# Patient Record
Sex: Male | Born: 1975 | Race: White | Hispanic: No | Marital: Single | Smoking: Current every day smoker
Health system: Southern US, Community
[De-identification: ages and names within clinical notes are randomized; demographics above are authoritative.]

## PROBLEM LIST (undated history)

## (undated) DIAGNOSIS — N009 Acute nephritic syndrome with unspecified morphologic changes: Secondary | ICD-10-CM

## (undated) DIAGNOSIS — K5792 Diverticulitis of intestine, part unspecified, without perforation or abscess without bleeding: Secondary | ICD-10-CM

## (undated) DIAGNOSIS — M722 Plantar fascial fibromatosis: Secondary | ICD-10-CM

---

## 2002-01-30 ENCOUNTER — Encounter: Payer: Self-pay | Admitting: Emergency Medicine

## 2002-01-30 ENCOUNTER — Emergency Department (HOSPITAL_COMMUNITY): Admission: EM | Admit: 2002-01-30 | Discharge: 2002-01-30 | Payer: Self-pay | Admitting: Emergency Medicine

## 2002-07-11 ENCOUNTER — Encounter: Payer: Self-pay | Admitting: Emergency Medicine

## 2002-07-11 ENCOUNTER — Emergency Department (HOSPITAL_COMMUNITY): Admission: EM | Admit: 2002-07-11 | Discharge: 2002-07-11 | Payer: Self-pay | Admitting: Emergency Medicine

## 2003-01-21 ENCOUNTER — Emergency Department (HOSPITAL_COMMUNITY): Admission: AD | Admit: 2003-01-21 | Discharge: 2003-01-22 | Payer: Self-pay | Admitting: Emergency Medicine

## 2019-09-12 ENCOUNTER — Encounter (HOSPITAL_COMMUNITY): Payer: Self-pay

## 2019-09-12 ENCOUNTER — Emergency Department (HOSPITAL_COMMUNITY)

## 2019-09-12 ENCOUNTER — Other Ambulatory Visit: Payer: Self-pay

## 2019-09-12 ENCOUNTER — Emergency Department (HOSPITAL_COMMUNITY)
Admission: EM | Admit: 2019-09-12 | Discharge: 2019-09-12 | Disposition: A | Discharge status: Home / Self Care | Attending: Emergency Medicine | Admitting: Emergency Medicine

## 2019-09-12 DIAGNOSIS — R000 Tachycardia, unspecified: Secondary | ICD-10-CM | POA: Diagnosis not present

## 2019-09-12 DIAGNOSIS — R4182 Altered mental status, unspecified: Secondary | ICD-10-CM | POA: Diagnosis present

## 2019-09-12 DIAGNOSIS — R404 Transient alteration of awareness: Secondary | ICD-10-CM | POA: Insufficient documentation

## 2019-09-12 DIAGNOSIS — F17210 Nicotine dependence, cigarettes, uncomplicated: Secondary | ICD-10-CM | POA: Insufficient documentation

## 2019-09-12 HISTORY — DX: Diverticulitis of intestine, part unspecified, without perforation or abscess without bleeding: K57.92

## 2019-09-12 HISTORY — DX: Acute nephritic syndrome with unspecified morphologic changes: N00.9

## 2019-09-12 HISTORY — DX: Plantar fascial fibromatosis: M72.2

## 2019-09-12 LAB — CBC WITH DIFFERENTIAL/PLATELET
Abs Immature Granulocytes: 0.03 10*3/uL (ref 0.00–0.07)
Basophils Absolute: 0 10*3/uL (ref 0.0–0.1)
Basophils Relative: 0 %
Eosinophils Absolute: 0.1 10*3/uL (ref 0.0–0.5)
Eosinophils Relative: 1 %
HCT: 40.8 % (ref 39.0–52.0)
Hemoglobin: 13.6 g/dL (ref 13.0–17.0)
Immature Granulocytes: 0 %
Lymphocytes Relative: 17 %
Lymphs Abs: 1.4 10*3/uL (ref 0.7–4.0)
MCH: 29.8 pg (ref 26.0–34.0)
MCHC: 33.3 g/dL (ref 30.0–36.0)
MCV: 89.5 fL (ref 80.0–100.0)
Monocytes Absolute: 0.5 10*3/uL (ref 0.1–1.0)
Monocytes Relative: 6 %
Neutro Abs: 6.2 10*3/uL (ref 1.7–7.7)
Neutrophils Relative %: 76 %
Platelets: 215 10*3/uL (ref 150–400)
RBC: 4.56 MIL/uL (ref 4.22–5.81)
RDW: 12.7 % (ref 11.5–15.5)
WBC: 8.3 10*3/uL (ref 4.0–10.5)
nRBC: 0 % (ref 0.0–0.2)

## 2019-09-12 LAB — COMPREHENSIVE METABOLIC PANEL
ALT: 43 U/L (ref 0–44)
AST: 25 U/L (ref 15–41)
Albumin: 4.1 g/dL (ref 3.5–5.0)
Alkaline Phosphatase: 52 U/L (ref 38–126)
Anion gap: 10 (ref 5–15)
BUN: 19 mg/dL (ref 6–20)
CO2: 22 mmol/L (ref 22–32)
Calcium: 8.5 mg/dL — ABNORMAL LOW (ref 8.9–10.3)
Chloride: 105 mmol/L (ref 98–111)
Creatinine, Ser: 1.06 mg/dL (ref 0.61–1.24)
GFR calc Af Amer: >60 mL/min (ref >60)
GFR calc non Af Amer: >60 mL/min (ref >60)
Glucose, Bld: 121 mg/dL — ABNORMAL HIGH (ref 70–99)
Potassium: 3.1 mmol/L — ABNORMAL LOW (ref 3.5–5.1)
Sodium: 137 mmol/L (ref 135–145)
Total Bilirubin: 0.4 mg/dL (ref 0.3–1.2)
Total Protein: 7.1 g/dL (ref 6.5–8.1)

## 2019-09-12 LAB — RAPID URINE DRUG SCREEN, HOSP PERFORMED
Amphetamines: NOT DETECTED
Barbiturates: NOT DETECTED
Benzodiazepines: NOT DETECTED
Cocaine: NOT DETECTED
Opiates: NOT DETECTED
Tetrahydrocannabinol: NOT DETECTED

## 2019-09-12 LAB — URINALYSIS, ROUTINE W REFLEX MICROSCOPIC
Bilirubin Urine: NEGATIVE
Glucose, UA: NEGATIVE mg/dL
Hgb urine dipstick: NEGATIVE
Ketones, ur: NEGATIVE mg/dL
Leukocytes,Ua: NEGATIVE
Nitrite: NEGATIVE
Protein, ur: NEGATIVE mg/dL
Specific Gravity, Urine: 1.024 (ref 1.005–1.030)
pH: 6 (ref 5.0–8.0)

## 2019-09-12 LAB — ETHANOL: Alcohol, Ethyl (B): <10 mg/dL (ref <10)

## 2019-09-12 LAB — CBG MONITORING, ED: Glucose-Capillary: 124 mg/dL — ABNORMAL HIGH (ref 70–99)

## 2019-09-12 LAB — LACTIC ACID, PLASMA: Lactic Acid, Venous: 2.6 mmol/L (ref 0.5–1.9)

## 2019-09-12 LAB — CK: Total CK: 91 U/L (ref 49–397)

## 2019-09-12 MED ORDER — SODIUM CHLORIDE 0.9 % IV BOLUS
1000.0000 mL | Freq: Once | INTRAVENOUS | Status: AC
  Administered 2019-09-12: 17:00:00 1000 mL via INTRAVENOUS

## 2019-09-12 MED ORDER — SODIUM CHLORIDE 0.9 % IV SOLN: INTRAVENOUS | Status: DC

## 2019-09-12 NOTE — ED Notes (Signed)
Date and time results received: 09/12/19 1740 Test: lactate Critical Value: 2.6  Name of Provider Notified: Jeraldine Loots

## 2019-09-12 NOTE — Discharge Instructions (Addendum)
As discussed, your evaluation today has been largely reassuring.  But, it is important that you monitor your condition carefully, and do not hesitate to return to the ED if you develop new, or concerning changes in your condition.  Otherwise, please follow-up with your physician for appropriate ongoing care.  The interim, please be sure to stay well-hydrated, drink plenty of fluids.  It is important that you avoid using any illicit substances.

## 2019-09-12 NOTE — ED Notes (Signed)
Pt in bed with eyes closed, resps even and unlabored, pt answering questions appropriately.

## 2019-09-12 NOTE — ED Notes (Signed)
Pt in bed, pt mumbles to questions asked, pt doesn't open eyes, pt will attempt to follow commands if they require little effort.  Cardiac monitor placed, O2 sat monitor in place, pt denies taking anything, officer at bedside.

## 2019-09-12 NOTE — ED Triage Notes (Signed)
EMS reports pt resident at [facility].  Reports pt was standing beside his bed and wouldn't let go of bed.   Says he was staring and wouldn't respond.  When they got him on the bed, he became violent,  Staff administered narcan and pt calmed down.  EMS says when they arrived, pt was laying face down in the floor, responsive to painful stimuli.   Pt has been somewhat verbal with ems at times.  Pt was able to state social security number.  Staff found some paper in the floor beside pt and they were suspicious it may have been K2.  CBG 125, HR 100, bp 130/80.

## 2019-09-12 NOTE — ED Notes (Signed)
Report called to pt's facility, guard states that he has the needed paperwork.  Pt verbalized understanding d/c instructions and follow up. Advised to  Return for any concerns or worsening symptoms.

## 2019-09-12 NOTE — ED Provider Notes (Signed)
[facility] EMERGENCY DEPARTMENT Provider Note   CSN: [id number] Arrival date & time: 09/12/19  1603     History Chief Complaint  Patient presents with  . Altered Mental Status    Noah Hill is a 44 y.o. male.  HPI    Patient presents via EMS from a present requirement for is found to be altered.  Level 5 caveat secondary to the patient's altered mental status. Deputies and EMS report that the patient was found alone in his bunk room, holding onto the wall in a rigid manner.  He was lowered to the ground, did not fall, but has not been following commands, interacting, or verbal since that time. There are some hypothesis of the patient obtaining K2.  No report of recent illness.  Past Medical History:  Diagnosis Date  . Acute glomerulonephritis   . Diverticulitis   . Plantar fascial fibromatosis     There are no problems to display for this patient.   Past Surgical History:  Procedure Laterality Date  . ANKLE SURGERY         No family history on file.  Social History   Tobacco Use  . Smoking status: Current Every Day Smoker  . Smokeless tobacco: Never Used  Substance Use Topics  . Alcohol use: Not Currently  . Drug use: Not on file    Comment: denies    Home Medications Prior to Admission medications   Not on File    Allergies    Peanut-containing drug products, Fish allergy, Ibuprofen, and Sulfa antibiotics  Review of Systems   Review of Systems  Unable to perform ROS: Mental status change    Physical Exam Updated Vital Signs BP 136/85   Pulse 88   Temp 98.3 F (36.8 C) (Oral)   Resp 16   Ht 5\' 8"  (1.727 m)   Wt 132.9 kg   SpO2 100%   BMI 44.55 kg/m   Physical Exam Vitals and nursing note reviewed.  Constitutional:      Appearance: He is well-developed. He is obese.     Comments: Obese noninteractive male breathing easily, not following commands, but moving extremities spontaneously, minimally.  HENT:     Head:  Normocephalic and atraumatic.  Eyes:     Conjunctiva/sclera: Conjunctivae normal.  Cardiovascular:     Rate and Rhythm: Regular rhythm. Tachycardia present.  Pulmonary:     Effort: Pulmonary effort is normal. No respiratory distress.     Breath sounds: No stridor.  Abdominal:     General: There is no distension.  Skin:    General: Skin is warm and dry.  Neurological:     Comments: Noninteractive, does not follow commands.  Reflexes intact, patient moves all extremities spontaneously, though again not to commands.  Face is symmetric, there is no speech.  Psychiatric:        Cognition and Memory: Cognition is impaired.     ED Results / Procedures / Treatments   Labs (all labs ordered are listed, but only abnormal results are displayed) Labs Reviewed  LACTIC ACID, PLASMA - Abnormal; Notable for the following components:      Result Value   Lactic Acid, Venous 2.6 (*)    All other components within normal limits  COMPREHENSIVE METABOLIC PANEL - Abnormal; Notable for the following components:   Potassium 3.1 (*)    Glucose, Bld 121 (*)    Calcium 8.5 (*)    All other components within normal limits  CBG MONITORING, ED - Abnormal;  Notable for the following components:   Glucose-Capillary 124 (*)    All other components within normal limits  RAPID URINE DRUG SCREEN, HOSP PERFORMED  ETHANOL  CBC WITH DIFFERENTIAL/PLATELET  URINALYSIS, ROUTINE W REFLEX MICROSCOPIC  CK    EKG EKG Interpretation  Date/Time:  Wednesday September 12 2019 16:32:13 EDT Ventricular Rate:  97 PR Interval:    QRS Duration: 106 QT Interval:  381 QTC Calculation: 484 R Axis:   3 Text Interpretation: Sinus rhythm Consider right atrial enlargement Borderline prolonged QT interval Abnormal ECG Confirmed by Gerhard Munch [id number]) on 09/12/2019 4:41:21 PM   Radiology CT HEAD WO CONTRAST  Result Date: 09/12/2019 CLINICAL DATA:  Altered mental status EXAM: CT HEAD WITHOUT CONTRAST TECHNIQUE: Contiguous  axial images were obtained from the base of the skull through the vertex without intravenous contrast. COMPARISON:  None. FINDINGS: Brain: No acute territorial infarction, hemorrhage or intracranial mass. Ventricles are nonenlarged. Vascular: No hyperdense vessel or unexpected calcification. Skull: Normal. Negative for fracture or focal lesion. Sinuses/Orbits: Left greater than right nasal bone deformity, probably chronic given absence of overlying significant soft tissue swelling Other: None. IMPRESSION: Negative non contrasted CT appearance of the brain. Electronically Signed   By: Jasmine Pang M.D.   On: 09/12/2019 17:25   DG Chest Port 1 View  Result Date: 09/12/2019 CLINICAL DATA:  Altered mental status. EXAM: PORTABLE CHEST 1 VIEW COMPARISON:  None. FINDINGS: Monitoring leads overlie the patient. Cardiac contours upper limits of normal. Bilateral interstitial pulmonary opacities. Low lung volumes. No pleural effusion or pneumothorax. Regional skeleton is unremarkable. IMPRESSION: Bilateral interstitial pulmonary opacities may represent edema or atypical infectious process. Low lung volumes. Electronically Signed   By: Annia Belt M.D.   On: 09/12/2019 16:56    Procedures Procedures (including critical care time)  Medications Ordered in ED Medications  sodium chloride 0.9 % bolus 1,000 mL (0 mLs Intravenous Stopped 09/12/19 1740)    And  0.9 %  sodium chloride infusion ( Intravenous New Bag/Given 09/12/19 1741)    ED Course  I have reviewed the triage vital signs and the nursing notes.  Pertinent labs & imaging results that were available during my care of the patient were reviewed by me and considered in my medical decision making (see chart for details).   With broad differential of intoxication versus metabolic arrangement versus intracranial pathology versus toxicology, patient had labs, CT, x-ray, EKG all ordered.  7:16 PM Labs unremarkable, patient awake, alert, in no distress,  denies any illicit ingestants. He states that he was smoking a cigarette, when he had a period of not recalling anything.  He offers no indication of having an idea of what happened prior to ED transfer.  Vital signs are unremarkable, he has no ongoing complaints, as above, he had absent alarming findings on labs, CT, x-ray, some suspicion for ingestion of foreign substance, versus dehydration, less likely syncope, though if this is a possibility, no evidence for sustained arrhythmia, patient is appropriate for discharge to the correctional facility. Final Clinical Impression(s) / ED Diagnoses Final diagnoses:  Transient alteration of awareness    Rx / DC Orders ED Discharge Orders    None       Gerhard Munch, MD 09/12/19 [phone]

## 2021-05-17 IMAGING — CT CT HEAD W/O CM
3 series · 16 of 47 positions shown, 19 images · non-contrast
Comparison: None.

CLINICAL DATA: Altered mental status

EXAM:
CT HEAD WITHOUT CONTRAST
TECHNIQUE: Contiguous axial images were obtained from the base of the skull
through the vertex without intravenous contrast.

[Series 2: head w o · axial · 0.44mm/px · z∈[+1293,+1433]mm · 10 of 34 slices shown, 13 images]
[im 3/34  brain]
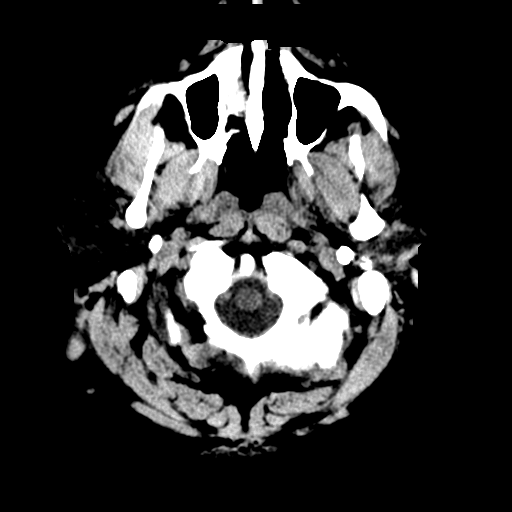
[im 3/34  bone]
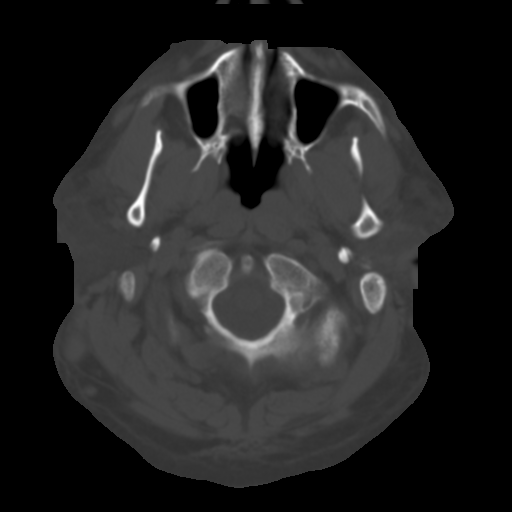
[im 6/34  brain]
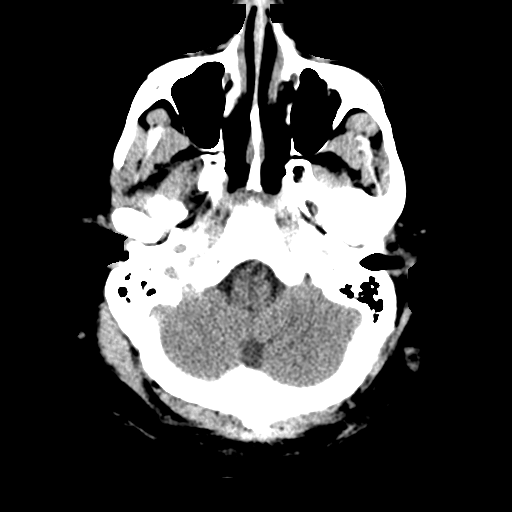
[im 10/34  brain]
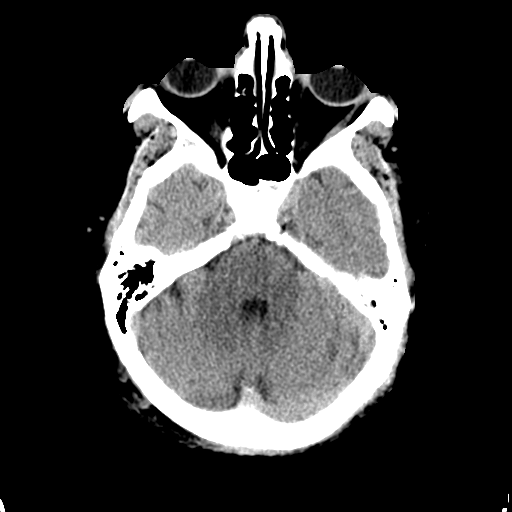
[im 12/34  brain]
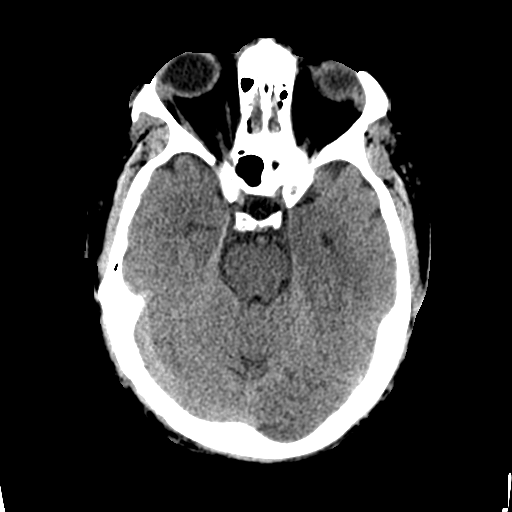
[im 15/34  brain]
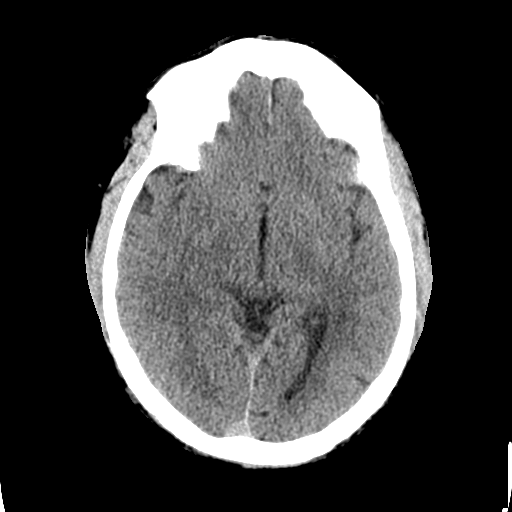
[im 15/34  bone]
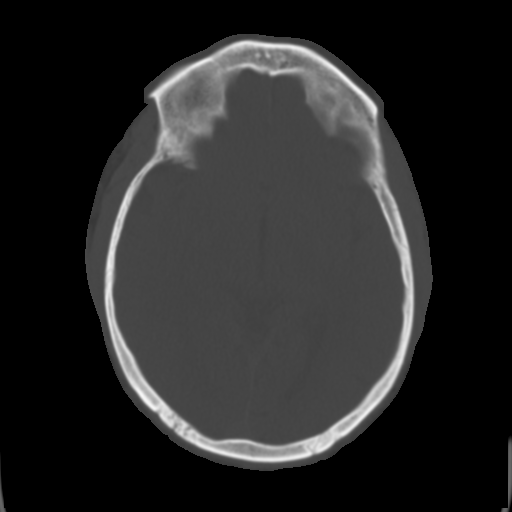
[im 19/34  brain]
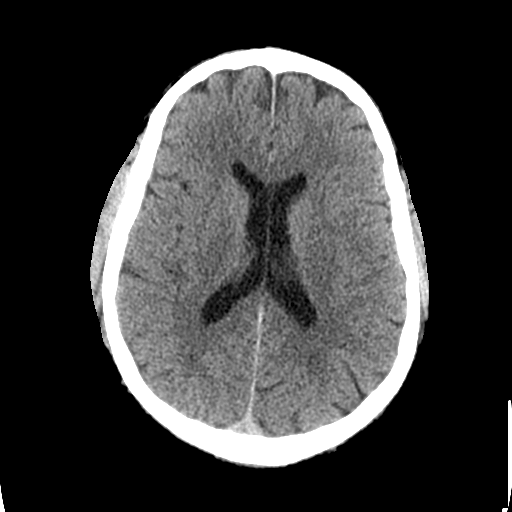
[im 22/34  brain]
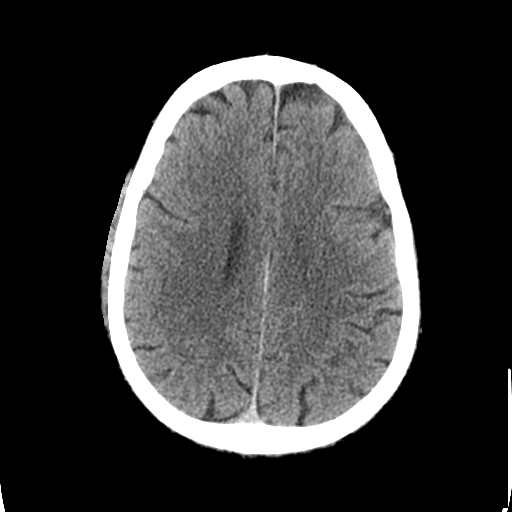
[im 26/34  brain]
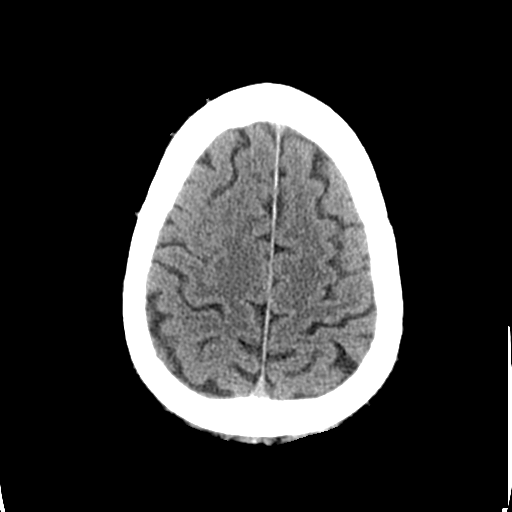
[im 28/34  brain]
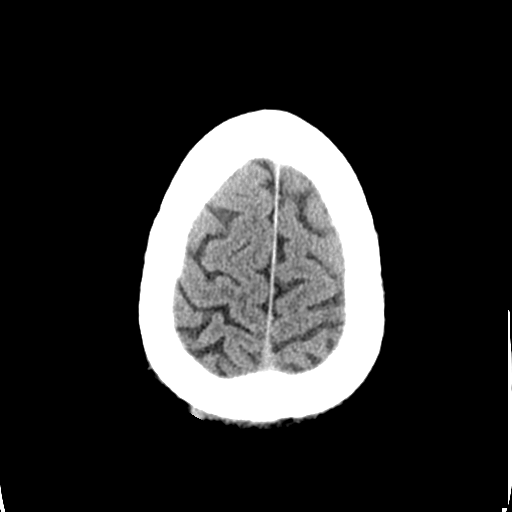
[im 28/34  bone]
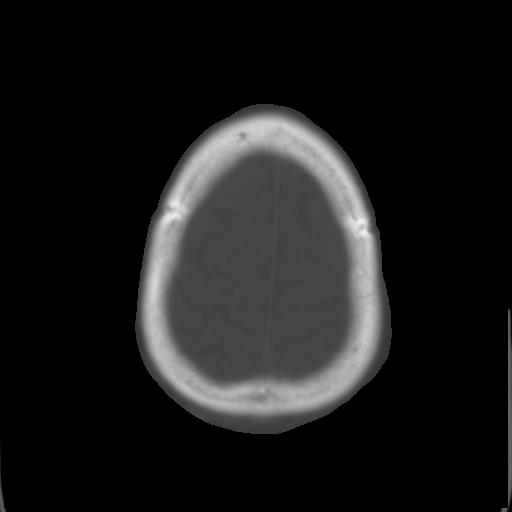
[im 31/34  brain]
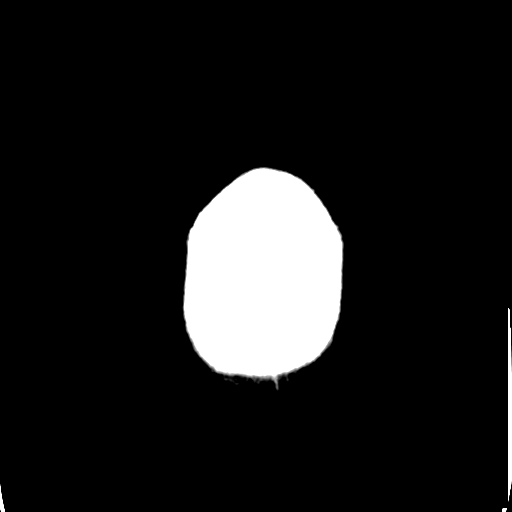

[Series 4: coronal soft · coronal · 0.34mm/px · 3 of 77 slices shown]
[im 26/77  brain]
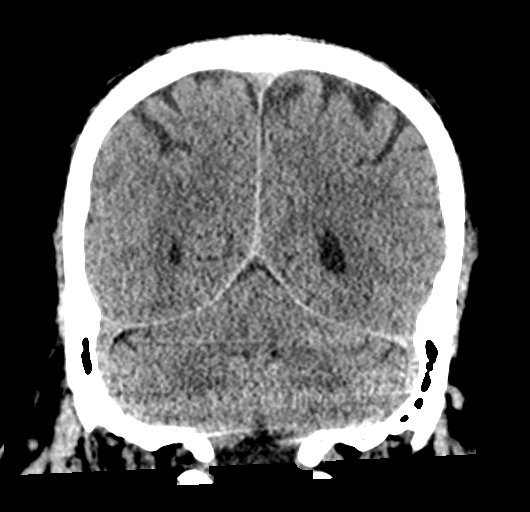
[im 34/77  brain]
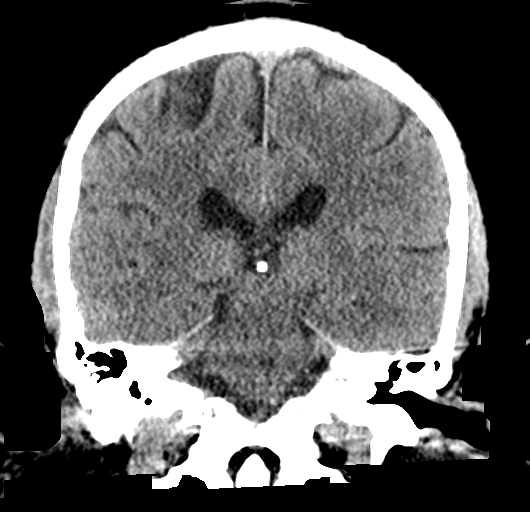
[im 43/77  brain]
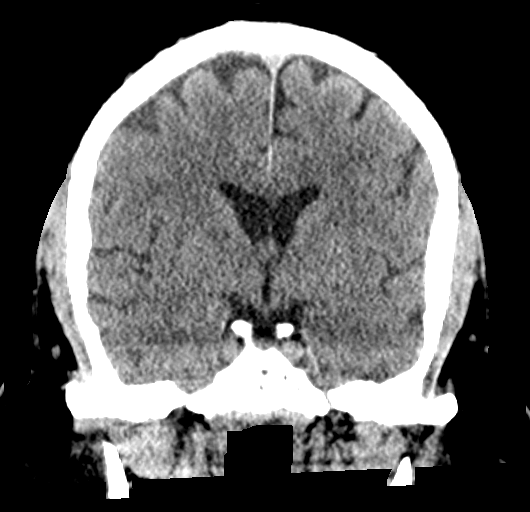

[Series 5: sagittal soft · sagittal · 0.34mm/px · 3 of 65 slices shown]
[im 22/65  brain]
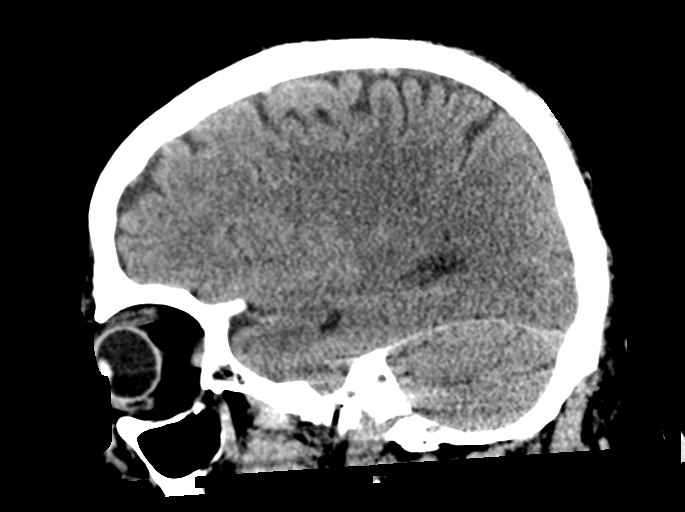
[im 33/65  brain]
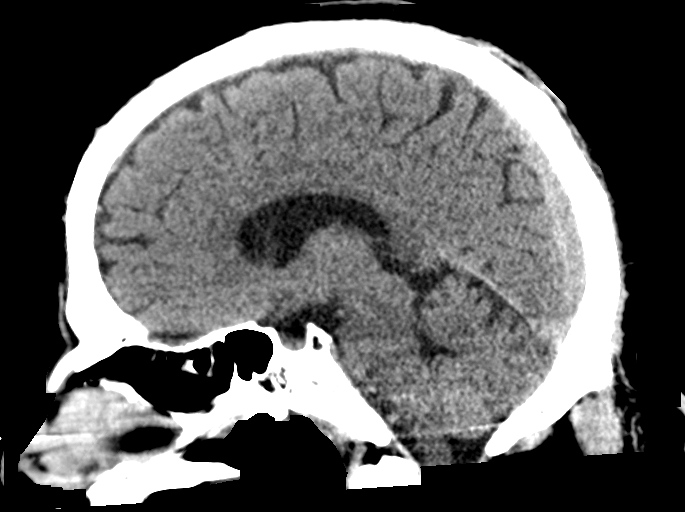
[im 43/65  brain]
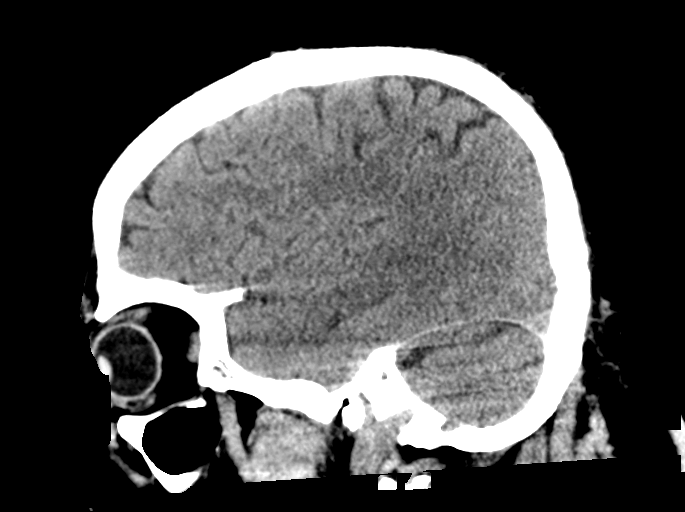

[16 of 47 positions shown; findings below may reference images not displayed]

FINDINGS: Brain: No acute territorial infarction, hemorrhage or intracranial
mass. Ventricles are nonenlarged.

Vascular: No hyperdense vessel or unexpected calcification.

Skull: Normal. Negative for fracture or focal lesion.

Sinuses/Orbits: Left greater than right nasal bone deformity,
probably chronic given absence of overlying significant soft tissue
swelling

Other: None.
IMPRESSION: Negative non contrasted CT appearance of the brain.
